# Patient Record
Sex: Female | Born: 1980 | Hispanic: Yes | Marital: Married | Smoking: Never smoker
Health system: Southern US, Community
[De-identification: ages and names within clinical notes are randomized; demographics above are authoritative.]

---

## 2005-03-27 ENCOUNTER — Emergency Department: Payer: Self-pay | Admitting: General Practice

## 2006-05-09 IMAGING — CT CT PARANASAL SINUSES W/O CM
1 series · 16 of 28 positions shown, 20 images · non-contrast
Comparison: none

REASON FOR EXAM: Headache
COMMENTS:

[Series 2: sinus · axial · 0.28mm/px · z∈[+401,+514]mm · 16 of 28 slices shown, 20 images]
[im 2/28  brain]
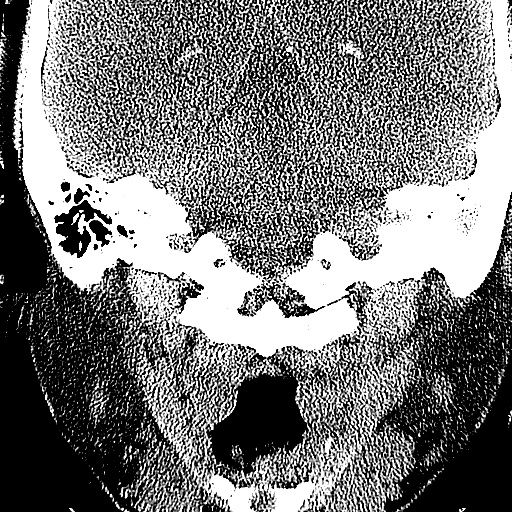
[im 2/28  bone]
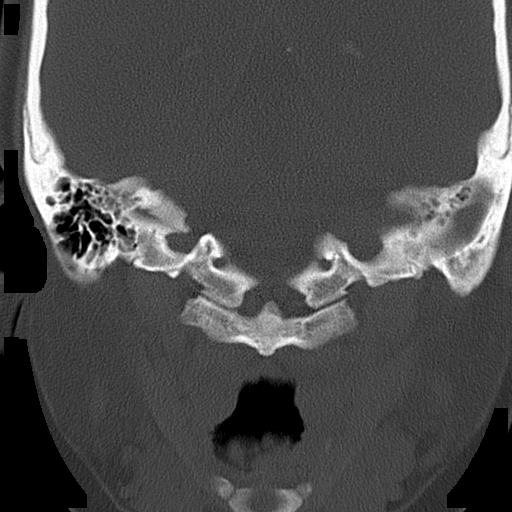
[im 4/28  bone]
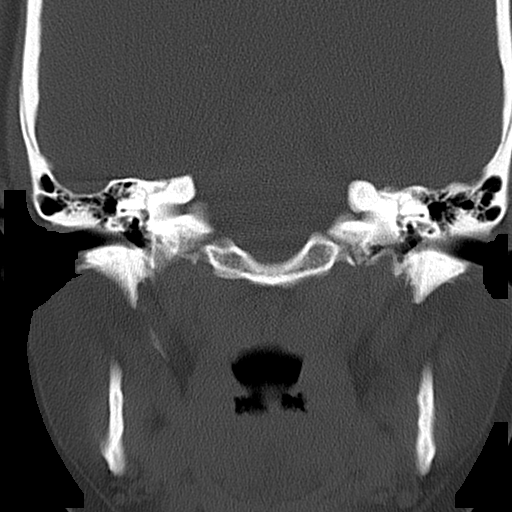
[im 6/28  bone]
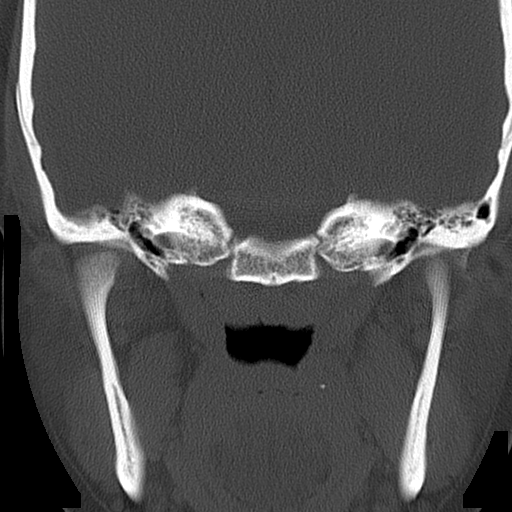
[im 7/28  bone]
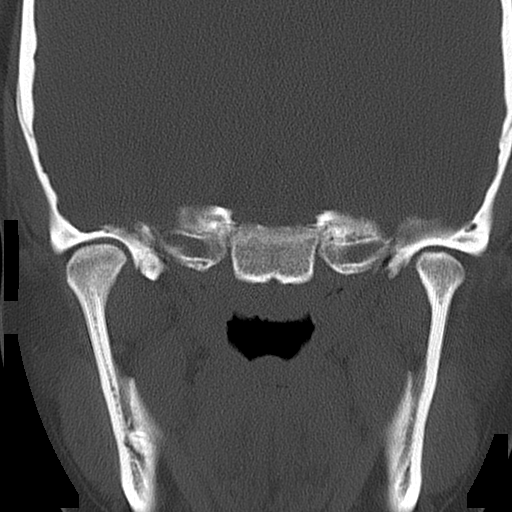
[im 9/28  brain]
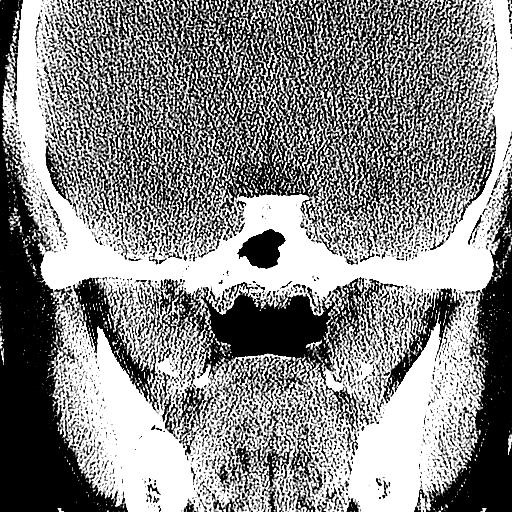
[im 9/28  bone]
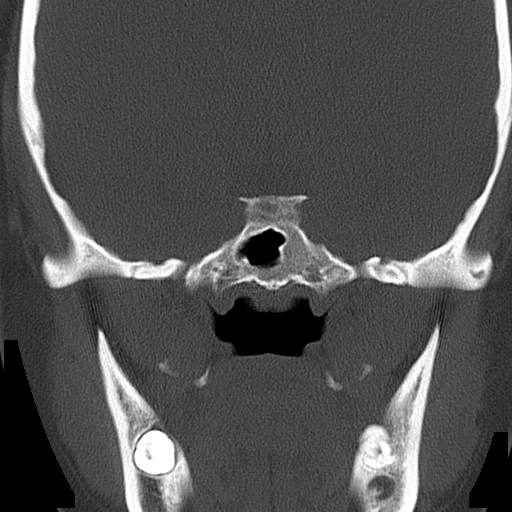
[im 10/28  bone]
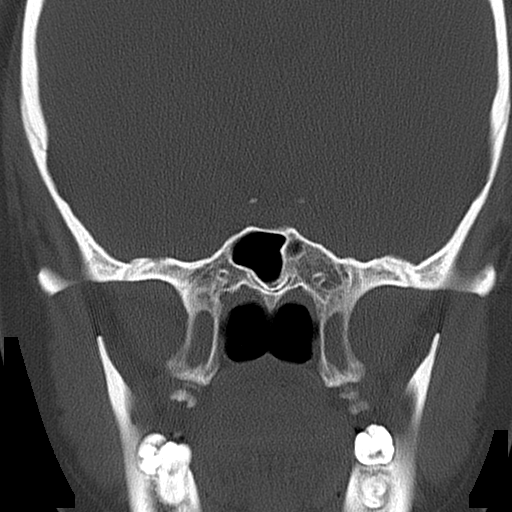
[im 12/28  bone]
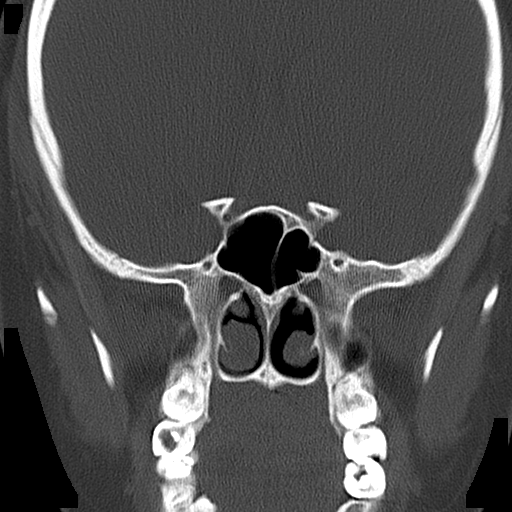
[im 14/28  bone]
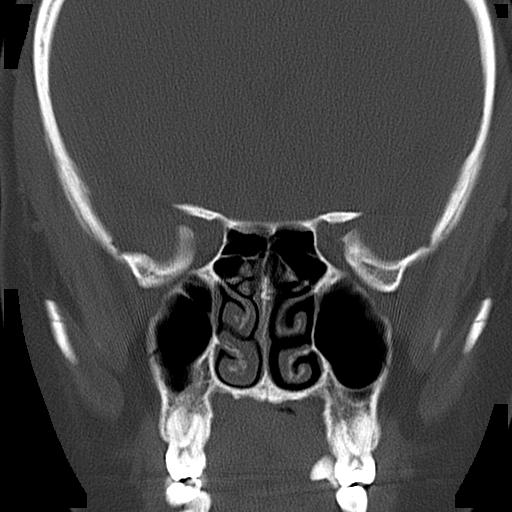
[im 15/28  brain]
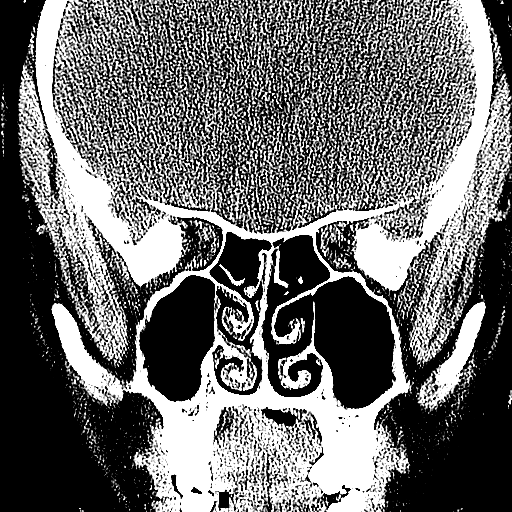
[im 15/28  bone]
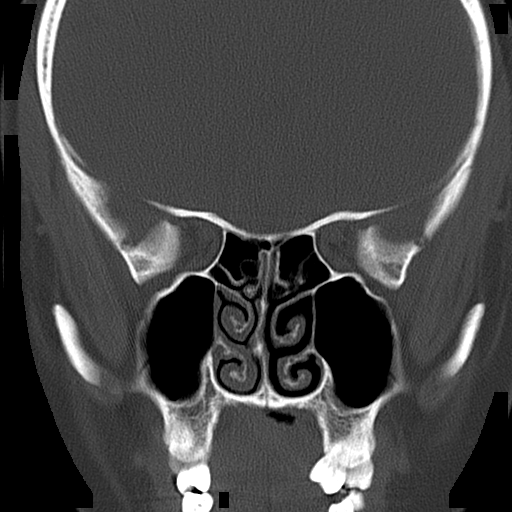
[im 17/28  bone]
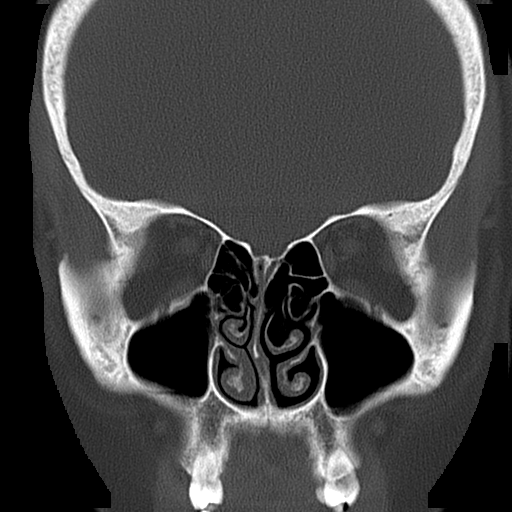
[im 19/28  bone]
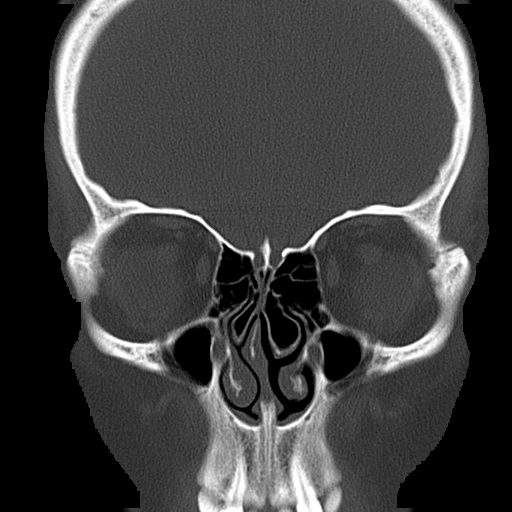
[im 20/28  bone]
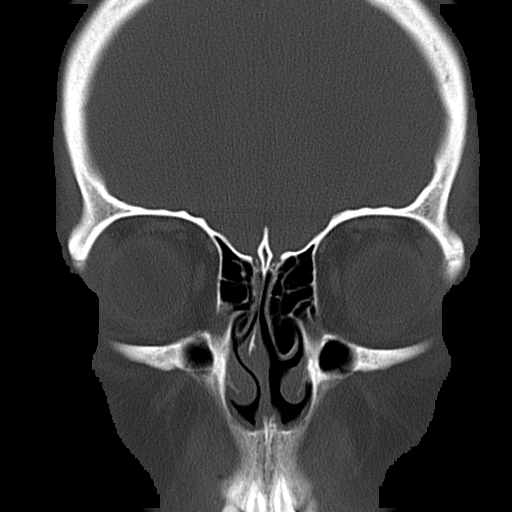
[im 22/28  brain]
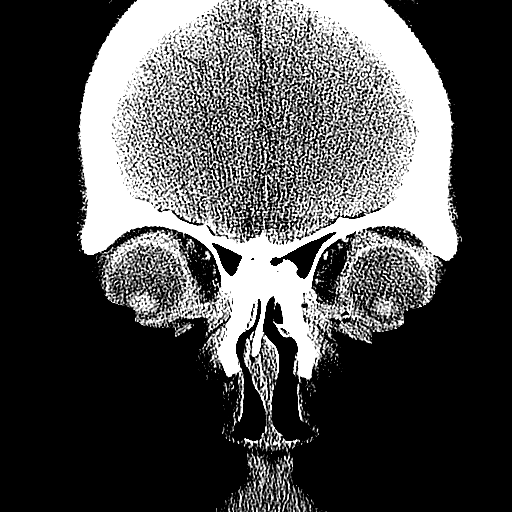
[im 22/28  bone]
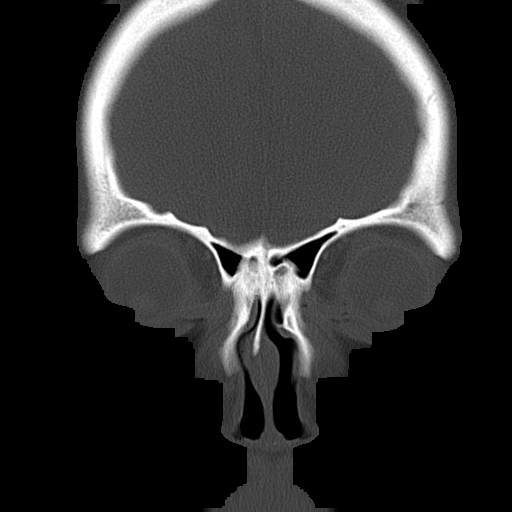
[im 23/28  bone]
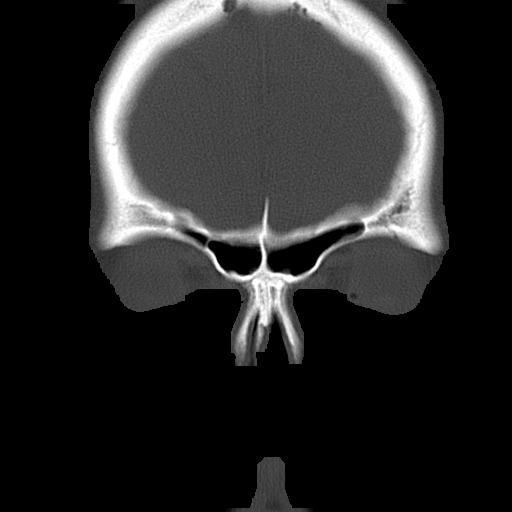
[im 25/28  bone]
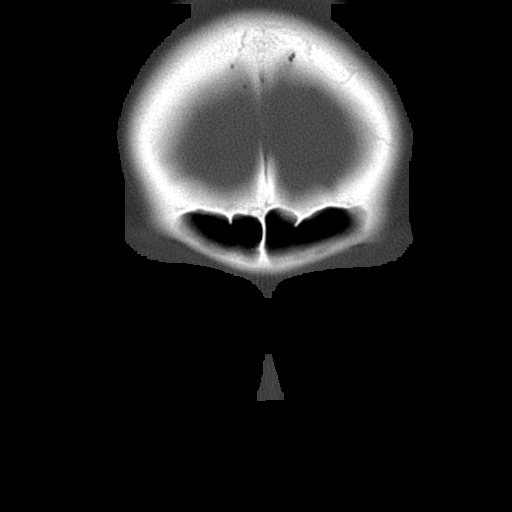
[im 27/28  bone]
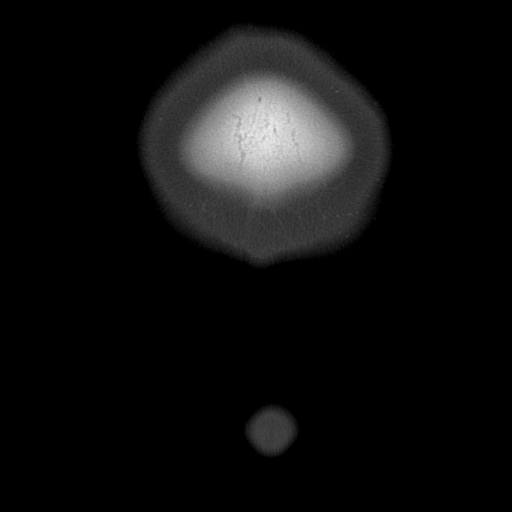

[16 of 28 positions shown; findings below may reference images not displayed]

PROCEDURE:     CT  - CT SINUSES WITHOUT CONTRAST  - March 27, 2005  [DATE]

RESULT:          The exam was originally read by [HOSPITAL].

The infundibulum is patent.  The sinuses are clear with no mucoperiosteal
thickening or air fluid levels identified.  There are noted bilateral concha
bullosa in the middle turbinates.

There is slight deviation of the nasal septum to the RIGHT.
IMPRESSION: No evidence of sinusitis is identified.

## 2006-05-09 IMAGING — CT CT HEAD WITHOUT CONTRAST
1 series · 16 of 28 positions shown, 20 images · non-contrast
Comparison: none

REASON FOR EXAM: Headache
COMMENTS:

PROCEDURE:     CT  - CT HEAD WITHOUT CONTRAST  - March 27, 2005  [DATE]
RESULT:     Unenhanced emergent head CT is performed for headache.
No intracerebral bleeds are noted. There is no mass effect and no shift of
the midline. No extra-axial fluid collections are identified.

[Series 2: without · axial · non-contrast · 0.44mm/px · z∈[+392,+517]mm · 16 of 28 slices shown, 20 images]
[im 2/28  brain]
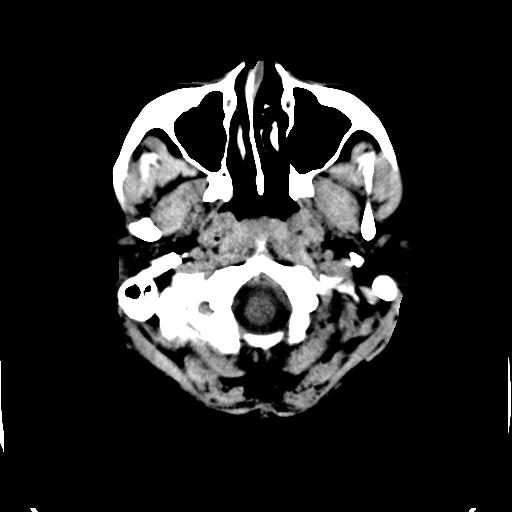
[im 2/28  bone]
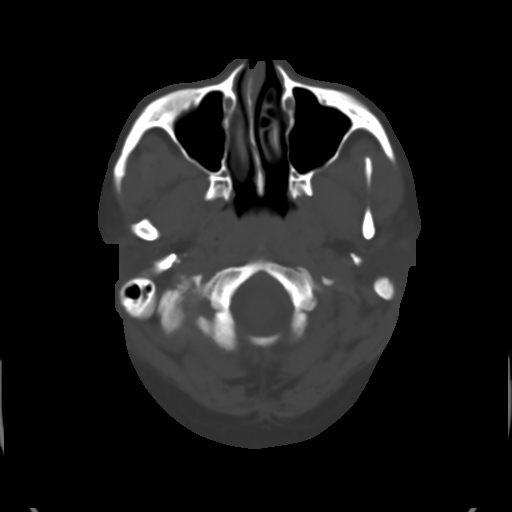
[im 4/28  brain]
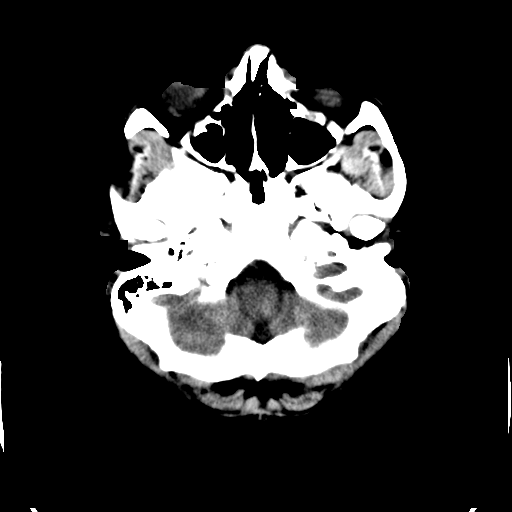
[im 6/28  brain]
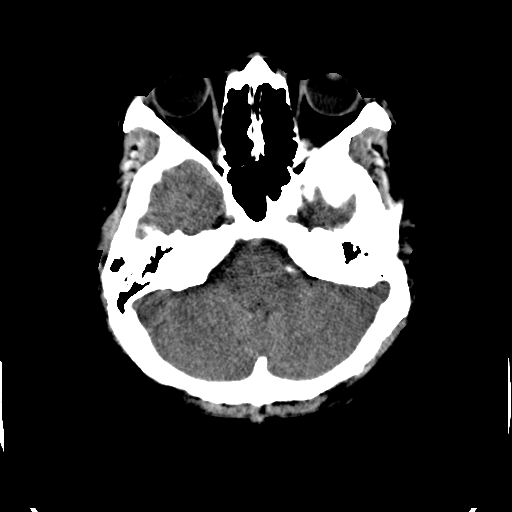
[im 7/28  brain]
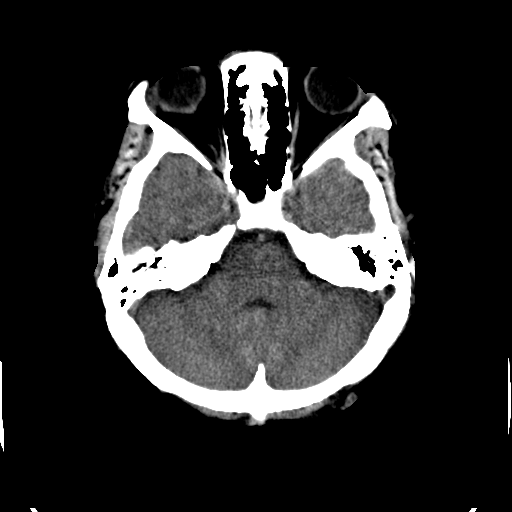
[im 9/28  brain]
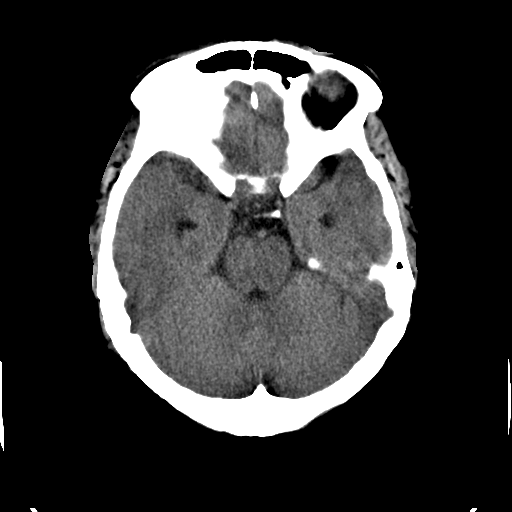
[im 9/28  bone]
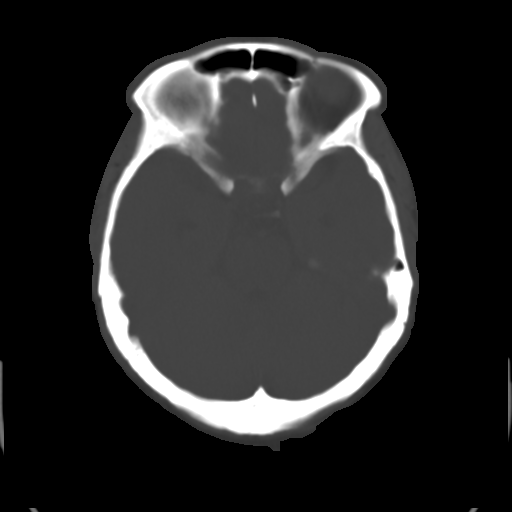
[im 10/28  brain]
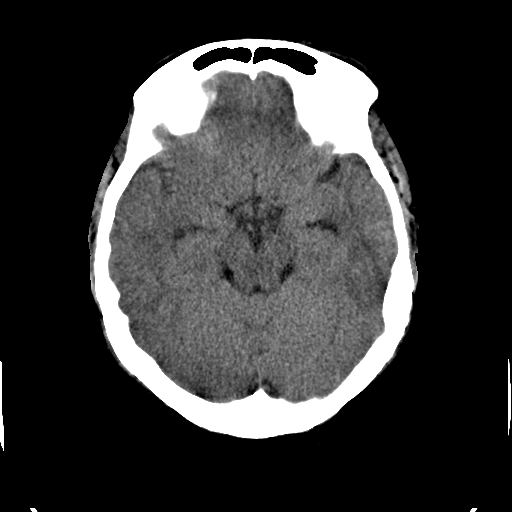
[im 12/28  brain]
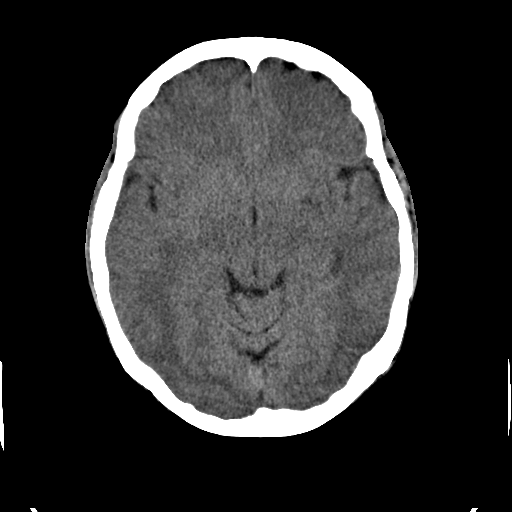
[im 14/28  brain]
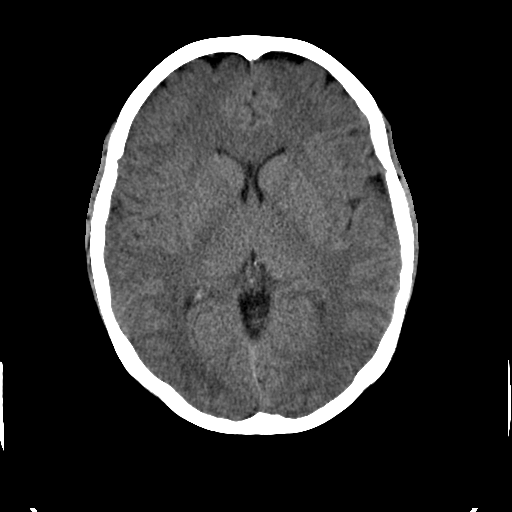
[im 15/28  brain]
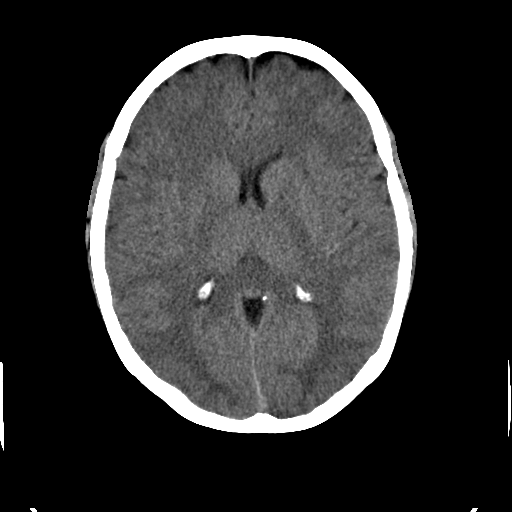
[im 15/28  bone]
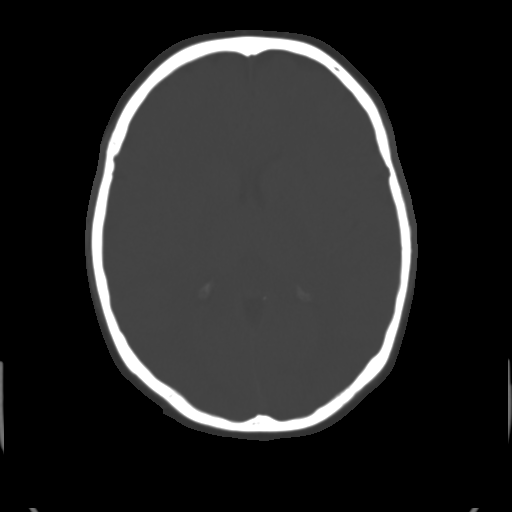
[im 17/28  brain]
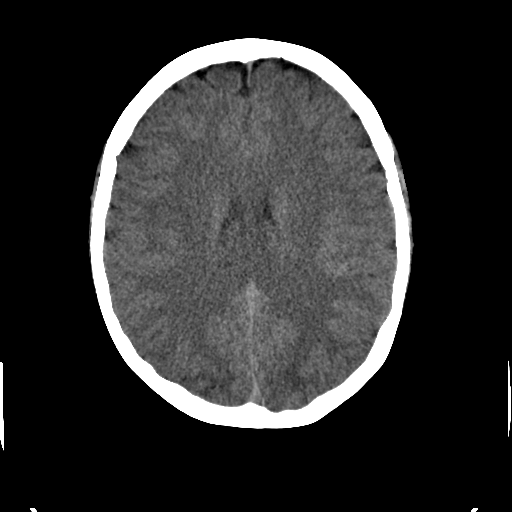
[im 19/28  brain]
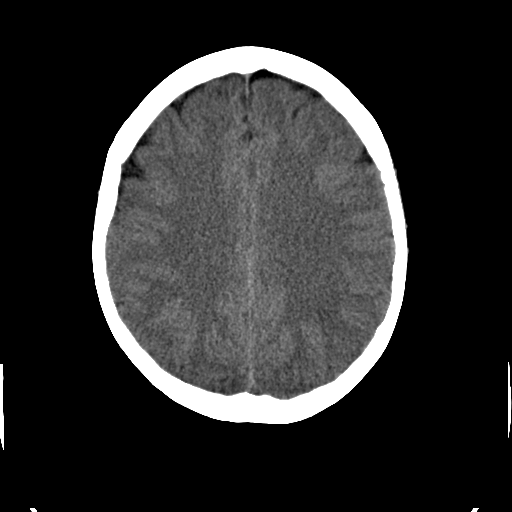
[im 20/28  brain]
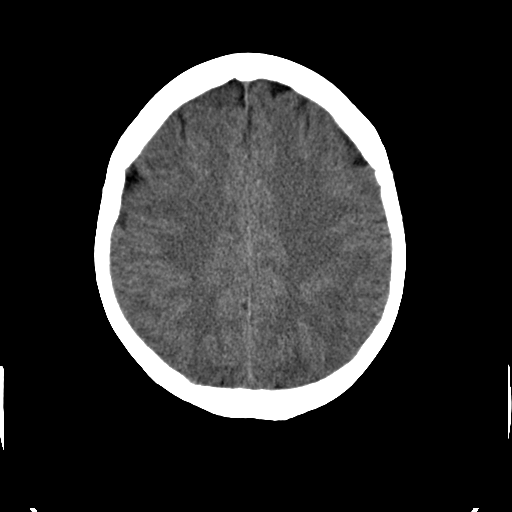
[im 22/28  brain]
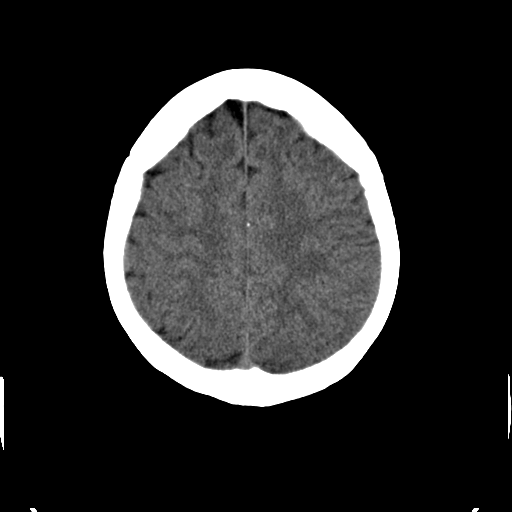
[im 22/28  bone]
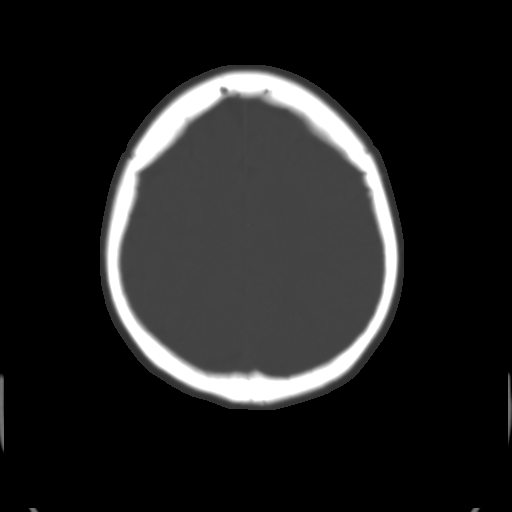
[im 23/28  brain]
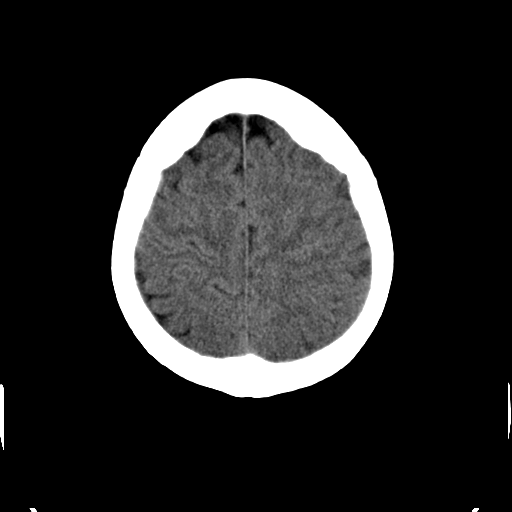
[im 25/28  brain]
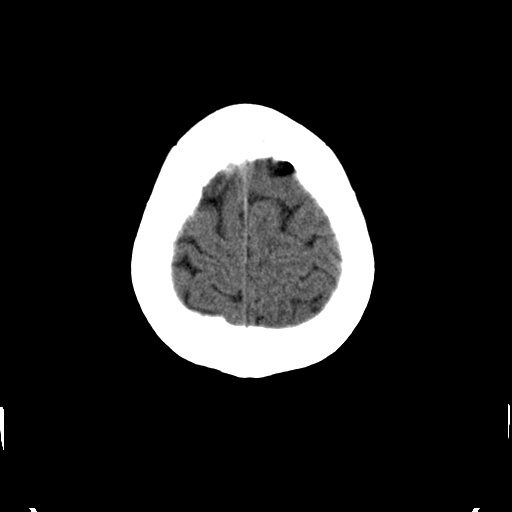
[im 27/28  brain]
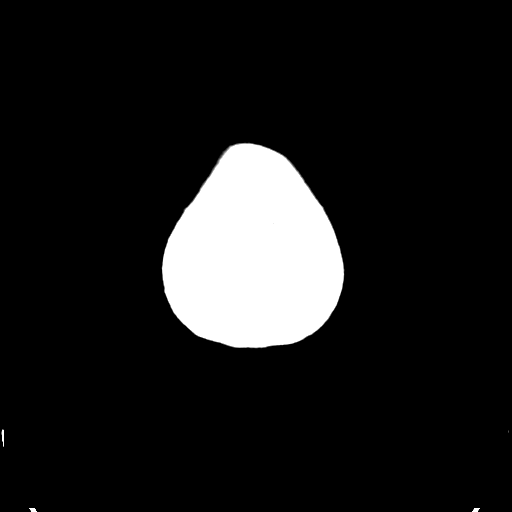

[16 of 28 positions shown; findings below may reference images not displayed]

IMPRESSION: Head CT within normal limits.

## 2019-10-14 ENCOUNTER — Other Ambulatory Visit: Payer: Self-pay

## 2019-10-14 DIAGNOSIS — Z20822 Contact with and (suspected) exposure to covid-19: Secondary | ICD-10-CM

## 2019-10-15 LAB — NOVEL CORONAVIRUS, NAA: SARS-CoV-2, NAA: NOT DETECTED

## 2021-03-31 ENCOUNTER — Other Ambulatory Visit: Payer: Self-pay

## 2021-03-31 ENCOUNTER — Emergency Department
Admission: EM | Admit: 2021-03-31 | Discharge: 2021-03-31 | Disposition: A | Discharge status: Home / Self Care | Payer: Self-pay | Attending: Emergency Medicine | Admitting: Emergency Medicine

## 2021-03-31 DIAGNOSIS — F332 Major depressive disorder, recurrent severe without psychotic features: Secondary | ICD-10-CM

## 2021-03-31 DIAGNOSIS — F32A Depression, unspecified: Secondary | ICD-10-CM

## 2021-03-31 DIAGNOSIS — Z20822 Contact with and (suspected) exposure to covid-19: Secondary | ICD-10-CM | POA: Insufficient documentation

## 2021-03-31 LAB — URINALYSIS, COMPLETE (UACMP) WITH MICROSCOPIC
Bilirubin Urine: NEGATIVE
Glucose, UA: NEGATIVE mg/dL
Ketones, ur: NEGATIVE mg/dL
Nitrite: NEGATIVE
Protein, ur: 100 mg/dL — AB
Specific Gravity, Urine: 1.026 (ref 1.005–1.030)
pH: 5 (ref 5.0–8.0)

## 2021-03-31 LAB — URINE DRUG SCREEN, QUALITATIVE (ARMC ONLY)
Amphetamines, Ur Screen: NOT DETECTED
Barbiturates, Ur Screen: NOT DETECTED
Benzodiazepine, Ur Scrn: NOT DETECTED
Cannabinoid 50 Ng, Ur ~~LOC~~: NOT DETECTED
Cocaine Metabolite,Ur ~~LOC~~: NOT DETECTED
MDMA (Ecstasy)Ur Screen: NOT DETECTED
Methadone Scn, Ur: NOT DETECTED
Opiate, Ur Screen: NOT DETECTED
Phencyclidine (PCP) Ur S: NOT DETECTED
Tricyclic, Ur Screen: NOT DETECTED

## 2021-03-31 LAB — COMPREHENSIVE METABOLIC PANEL
ALT: 16 U/L (ref 0–44)
AST: 19 U/L (ref 15–41)
Albumin: 3.7 g/dL (ref 3.5–5.0)
Alkaline Phosphatase: 59 U/L (ref 38–126)
Anion gap: 9 (ref 5–15)
BUN: 21 mg/dL — ABNORMAL HIGH (ref 6–20)
CO2: 21 mmol/L — ABNORMAL LOW (ref 22–32)
Calcium: 8.9 mg/dL (ref 8.9–10.3)
Chloride: 108 mmol/L (ref 98–111)
Creatinine, Ser: 1.02 mg/dL — ABNORMAL HIGH (ref 0.44–1.00)
GFR, Estimated: >60 mL/min (ref >60)
Glucose, Bld: 103 mg/dL — ABNORMAL HIGH (ref 70–99)
Potassium: 3.8 mmol/L (ref 3.5–5.1)
Sodium: 138 mmol/L (ref 135–145)
Total Bilirubin: 0.4 mg/dL (ref 0.3–1.2)
Total Protein: 7.5 g/dL (ref 6.5–8.1)

## 2021-03-31 LAB — CBC
HCT: 33.3 % — ABNORMAL LOW (ref 36.0–46.0)
Hemoglobin: 10.2 g/dL — ABNORMAL LOW (ref 12.0–15.0)
MCH: 23.3 pg — ABNORMAL LOW (ref 26.0–34.0)
MCHC: 30.6 g/dL (ref 30.0–36.0)
MCV: 76.2 fL — ABNORMAL LOW (ref 80.0–100.0)
Platelets: 376 10*3/uL (ref 150–400)
RBC: 4.37 MIL/uL (ref 3.87–5.11)
RDW: 15.6 % — ABNORMAL HIGH (ref 11.5–15.5)
WBC: 12.8 10*3/uL — ABNORMAL HIGH (ref 4.0–10.5)
nRBC: 0 % (ref 0.0–0.2)

## 2021-03-31 LAB — ETHANOL: Alcohol, Ethyl (B): <10 mg/dL (ref <10)

## 2021-03-31 LAB — POC URINE PREG, ED: Preg Test, Ur: NEGATIVE

## 2021-03-31 LAB — RESP PANEL BY RT-PCR (FLU A&B, COVID) ARPGX2
Influenza A by PCR: NEGATIVE
Influenza B by PCR: NEGATIVE
SARS Coronavirus 2 by RT PCR: NEGATIVE

## 2021-03-31 MED ORDER — IBUPROFEN 600 MG PO TABS
600.0000 mg | ORAL_TABLET | Freq: Once | ORAL | Status: AC
  Administered 2021-03-31: 600 mg via ORAL
  Filled 2021-03-31: qty 1

## 2021-03-31 NOTE — ED Notes (Signed)
VOL, pending placement 

## 2021-03-31 NOTE — ED Triage Notes (Signed)
Pt here for depression, states has been depressed for some time. Was started on fluoxetine 2 days ago and states she had a psychotic break. Has been having trouble with husband and today became angry and broke things in the house. Pt unsure of she has had SI.

## 2021-03-31 NOTE — Discharge Instructions (Signed)
You have been seen in the Emergency Department (ED)  today for a psychiatric complaint.  You have been evaluated by psychiatry and we believe you are safe to be discharged from the hospital.   ° °Please return to the Emergency Department (ED)  immediately if you have ANY thoughts of hurting yourself or anyone else, so that we may help you. ° °Please avoid alcohol and drug use. ° °Follow up with your doctor and/or therapist as soon as possible regarding today's ED  visit.  ° °You may call crisis hotline for [place] at [phone]. ° °

## 2021-03-31 NOTE — ED Notes (Signed)
Vol, pt discharged to home per [facility]

## 2021-03-31 NOTE — BH Assessment (Signed)
Comprehensive Clinical Assessment (CCA) Note  03/31/2021 Caroline Sutton [id number]   Lowella Dandy, 40 year old female who presents to [facility] ED voluntarily for treatment. Per triage note, Pt here for depression, states has been depressed for some time. Was started on fluoxetine 2 days ago and states she had a psychotic break. Has been having trouble with husband and today became angry and broke things in the house. Pt unsure of she has had SI.   During TTS assessment pt presents alert and oriented x 4, restless but cooperative, and mood-congruent with affect. The pt does not appear to be responding to internal or external stimuli. Neither is the pt presenting with any delusional thinking. Pt verified the information provided to triage RN.   Pt identifies her main complaint to be that she is depressed. Patient states she and her husband got into an argument and she became very upset. Patient states they are having marital issues which is causing the depression. Patient states she is not sleeping well; yet her appetite has not changed. 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 Pt reports no INPT hx or OPT hx. Pt reports family hx of depression on her mom's side. Pt reports no medical hx. Patient sees her primary doctor, Dr. Wayne Sever in [place], [place] who started her on fluoxetine and she has been taking as prescribed for the last 2 days. Pt denies current SI/HI/AH/VH.    Per Annice Pih, NP, pt does not meet criteria for inpatient psychiatric admission. TTS will provide outpatient resources for therapy.     Chief Complaint:  Chief Complaint  Patient presents with  . Depression   Visit Diagnosis: Major Depressive Disorder    CCA Screening, Triage and Referral (STR)  Patient Reported Information How did you hear about [place]? Self  Referral name: No data recorded Referral phone number: No data recorded  Whom do you see for routine medical problems? -- (Dr. Wayne Sever in [place],  [place])  Practice/Facility Name: No data recorded Practice/Facility Phone Number: No data recorded Name of Contact: No data recorded Contact Number: No data recorded Contact Fax Number: No data recorded Prescriber Name: No data recorded Prescriber Address (if known): No data recorded  What Is the Reason for Your Visit/Call Today? Patient states she became really angry after getting into an argument with her husband.  How Long Has This Been Causing You Problems? <Week  What Do You Feel Would Help You the Most Today? Stress Management; Medication(s)   Have You Recently Been in Any Inpatient Treatment (Hospital/Detox/Crisis Center/28-Day Program)? No  Name/Location of Program/Hospital:No data recorded How Long Were You There? No data recorded When Were You Discharged? No data recorded  Have You Ever Received Services From [facility] Before? No  Who Do You See at [facility]? No data recorded  Have You Recently Had Any Thoughts About Hurting Yourself? No  Are You Planning to Commit Suicide/Harm Yourself At This time? No   Have you Recently Had Thoughts About Hurting Someone Karolee Ohs? No  Explanation: No data recorded  Have You Used Any Alcohol or Drugs in the Past 24 Hours? No  How Long Ago Did You Use Drugs or Alcohol? No data recorded What Did You Use and How Much? No data recorded  Do You Currently Have a Therapist/Psychiatrist? No  Name of Therapist/Psychiatrist: No data recorded  Have You Been Recently Discharged From Any Office Practice or Programs? No  Explanation of Discharge From Practice/Program: No data recorded  CCA Screening Triage Referral Assessment Type of Contact: Face-to-Face  Is this Initial or Reassessment? No data recorded Date Telepsych consult ordered in [facility]:  No data recorded Time Telepsych consult ordered in [facility]:  No data recorded  Patient Reported Information Reviewed? Yes  Patient Left Without Being Seen? No data recorded Reason for Not  Completing Assessment: No data recorded  Collateral Involvement: None provided   Does Patient Have a Court Appointed Legal Guardian? No data recorded Name and Contact of Legal Guardian: No data recorded If Minor and Not Living with Parent(s), Who has Custody? n/a  Is CPS involved or ever been involved? Never  Is APS involved or ever been involved? Never   Patient Determined To Be At Risk for Harm To Self or Others Based on Review of Patient Reported Information or Presenting Complaint? No  Method: No data recorded Availability of Means: No data recorded Intent: No data recorded Notification Required: No data recorded Additional Information for Danger to Others Potential: No data recorded Additional Comments for Danger to Others Potential: No data recorded Are There Guns or Other Weapons in Your Home? No data recorded Types of Guns/Weapons: No data recorded Are These Weapons Safely Secured?                            No data recorded Who Could Verify You Are Able To Have These Secured: No data recorded Do You Have any Outstanding Charges, Pending Court Dates, Parole/Probation? No data recorded Contacted To Inform of Risk of Harm To Self or Others: No data recorded  Location of Assessment: [facility] ED   Does Patient Present under Involuntary Commitment? No  IVC Papers Initial File Date: No data recorded  [place] of Residence: [place]   Patient Currently Receiving the Following Services: Medication Management   Determination of Need: Urgent (48 hours)   Options For Referral: ED Visit; Medication Management; Intensive Outpatient Therapy     CCA Biopsychosocial Intake/Chief Complaint:  No data recorded Current Symptoms/Problems: No data recorded  Patient Reported Schizophrenia/Schizoaffective Diagnosis in Past: No data recorded  Strengths: No data recorded Preferences: No data recorded Abilities: No data recorded  Type of Services Patient Feels are Needed: No data  recorded  Initial Clinical Notes/Concerns: No data recorded  Mental Health Symptoms Depression:  No data recorded  Duration of Depressive symptoms: No data recorded  Mania:  No data recorded  Anxiety:   No data recorded  Psychosis:  No data recorded  Duration of Psychotic symptoms: No data recorded  Trauma:  No data recorded  Obsessions:  No data recorded  Compulsions:  No data recorded  Inattention:  No data recorded  Hyperactivity/Impulsivity:  No data recorded  Oppositional/Defiant Behaviors:  No data recorded  Emotional Irregularity:  No data recorded  Other Mood/Personality Symptoms:  No data recorded   Mental Status Exam Appearance and self-care  Stature:  No data recorded  Weight:  No data recorded  Clothing:  No data recorded  Grooming:  No data recorded  Cosmetic use:  No data recorded  Posture/gait:  No data recorded  Motor activity:  No data recorded  Sensorium  Attention:  No data recorded  Concentration:  No data recorded  Orientation:  No data recorded  Recall/memory:  No data recorded  Affect and Mood  Affect:  No data recorded  Mood:  No data recorded  Relating  Eye contact:  No data recorded  Facial expression:  No data recorded  Attitude toward examiner:  No data recorded  Thought and Language  Speech flow: No data recorded  Thought content:  No data recorded  Preoccupation:  No data recorded  Hallucinations:  No data recorded  Organization:  No data recorded  [employer] of Knowledge:  No data recorded  Intelligence:  No data recorded  Abstraction:  No data recorded  Judgement:  No data recorded  Reality Testing:  No data recorded  Insight:  No data recorded  Decision Making:  No data recorded  Social Functioning  Social Maturity:  No data recorded  Social Judgement:  No data recorded  Stress  Stressors:  No data recorded  Coping Ability:  No data recorded  Skill Deficits:  No data recorded  Supports:  No data recorded     Religion:    Leisure/Recreation:    Exercise/Diet:     CCA Employment/Education Employment/Work Situation:    Education:     CCA Family/Childhood History Family and Relationship History:    Childhood History:     Child/Adolescent Assessment:     CCA Substance Use Alcohol/Drug Use:                           ASAM's:  Six Dimensions of Multidimensional Assessment  Dimension 1:  Acute Intoxication and/or Withdrawal Potential:      Dimension 2:  Biomedical Conditions and Complications:      Dimension 3:  Emotional, Behavioral, or Cognitive Conditions and Complications:     Dimension 4:  Readiness to Change:     Dimension 5:  Relapse, Continued use, or Continued Problem Potential:     Dimension 6:  Recovery/Living Environment:     ASAM Severity Score:    ASAM Recommended Level of Treatment:     Substance use Disorder (SUD)    Recommendations for Services/Supports/Treatments:    DSM5 Diagnoses: Patient Active Problem List   Diagnosis Date Noted  . MDD (major depressive disorder), recurrent episode, severe (HCC) 03/31/2021    Patient Centered Plan: Patient is on the following Treatment Plan(s):  Depression   Referrals to Alternative Service(s): Referred to Alternative Service(s):   Place:   Date:   Time:    Referred to Alternative Service(s):   Place:   Date:   Time:    Referred to Alternative Service(s):   Place:   Date:   Time:    Referred to Alternative Service(s):   Place:   Date:   Time:     Jacqui Headen Dierdre Searles, Counselor, LCAS-A

## 2021-03-31 NOTE — ED Notes (Addendum)
Belongings removed [employer] bra Panties Red leggings  White shoes Hair tie  Watch Black purse  All locked in [employer] area

## 2021-03-31 NOTE — ED Provider Notes (Addendum)
[facility] Emergency Department Provider Note  ____________________________________________  Time seen: Approximately 2:44 AM  I have reviewed the triage vital signs and the nursing notes.   HISTORY  Chief Complaint Depression   HPI Caroline Sutton is a 40 y.o. female who presents for evaluation of depression.  Patient reports that she is having marital problems and has been very depressed over the last month.  She denies any prior history of depression.  She saw her primary care doctor who started her on fluoxetine.  She has taken it for the last 2 days.  Today she reports that she had "a psychotic break."  She had a very bad argument with her husband, became very angry, broke things around the house.  She reports that that happened in front of her child and she felt really bad.  She denies any suicidal or homicidal thoughts.  She is requesting help.  She is complaining of a very bad headache that started during this episode.  She usually takes ibuprofen for these headaches.  She denies any other medical complaints.   PMH None   Allergies Morphine and related  No family history on file.  Social History  Drugs - no Smoking - no  Review of Systems  Constitutional: Negative for fever. Eyes: Negative for visual changes. ENT: Negative for sore throat. Neck: No neck pain  Cardiovascular: Negative for chest pain. Respiratory: Negative for shortness of breath. Gastrointestinal: Negative for abdominal pain, vomiting or diarrhea. Genitourinary: Negative for dysuria. Musculoskeletal: Negative for back pain. Skin: Negative for rash. Neurological: Negative for headaches, weakness or numbness. Psych: No SI or HI. + depression  ____________________________________________   PHYSICAL EXAM:  VITAL SIGNS: ED Triage Vitals [03/31/21 0205]  Enc Vitals Group     BP (!) 199/136     Pulse Rate 90     Resp 18     Temp 98.5 F (36.9 C)     Temp Source Oral      SpO2 98 %     Weight 205 lb (93 kg)     Height 5\' 3"  (1.6 m)     Head Circumference      Peak Flow      Pain Score 3     Pain Loc      Pain Edu?      Excl. in GC?     Constitutional: Alert and oriented. Well appearing and in no apparent distress. HEENT:      Head: Normocephalic and atraumatic.         Eyes: Conjunctivae are normal. Sclera is non-icteric.       Mouth/Throat: Mucous membranes are moist.       Neck: Supple with no signs of meningismus. Cardiovascular: Regular rate and rhythm. No murmurs, gallops, or rubs. 2+ symmetrical distal pulses are present in all extremities. No JVD. Respiratory: Normal respiratory effort. Lungs are clear to auscultation bilaterally.  Gastrointestinal: Soft, non tender, and non distended with positive bowel sounds. No rebound or guarding. Genitourinary: No CVA tenderness. Musculoskeletal:  No edema, cyanosis, or erythema of extremities. Neurologic: Normal speech and language. Face is symmetric. Moving all extremities. No gross focal neurologic deficits are appreciated. Skin: Skin is warm, dry and intact. No rash noted. Psychiatric: Mood and affect are depressed. Speech and behavior are normal.  ____________________________________________   LABS (all labs ordered are listed, but only abnormal results are displayed)  Labs Reviewed  CBC - Abnormal; Notable for the following components:  Result Value   WBC 12.8 (*)    Hemoglobin 10.2 (*)    HCT 33.3 (*)    MCV 76.2 (*)    MCH 23.3 (*)    RDW 15.6 (*)    All other components within normal limits  COMPREHENSIVE METABOLIC PANEL - Abnormal; Notable for the following components:   CO2 21 (*)    Glucose, Bld 103 (*)    BUN 21 (*)    Creatinine, Ser 1.02 (*)    All other components within normal limits  RESP PANEL BY RT-PCR (FLU A&B, COVID) ARPGX2  ETHANOL  URINALYSIS, COMPLETE (UACMP) WITH MICROSCOPIC  URINE DRUG SCREEN, QUALITATIVE ([facility] ONLY)  POC URINE PREG, ED    ____________________________________________  EKG  none  ____________________________________________  RADIOLOGY  none  ____________________________________________   PROCEDURES  Procedure(s) performed: None Procedures Critical Care performed:  None ____________________________________________   INITIAL IMPRESSION / ASSESSMENT AND PLAN / ED COURSE   40 y.o. female who presents for evaluation of depression.  No suicidal thoughts.  Patient does not meet IVC criteria.  Will consult psychiatry.   The patient has been placed in psychiatric observation due to the need to provide a safe environment for the patient while obtaining psychiatric consultation and evaluation, as well as ongoing medical and medication management to treat the patient's condition.  The patient has not been placed under full IVC at this time.  _________________________ 6:32 AM on 03/31/2021 -----------------------------------------  Patient seen by psychiatry and recommended outpatient management.  Resources were provided to patient.  She is currently being treated by her primary care doctor.  Recommended that she continue the medication that she started 2 days ago.  We discussed returning to the emergency room for any suicidal thoughts or if her symptoms are getting worse.  Patient provided with a crisis hotline.  Patient continues to deny suicidal thoughts and can contract for safety.      _____________________________________________ Please note:  Patient was evaluated in Emergency Department today for the symptoms described in the history of present illness. Patient was evaluated in the context of the global COVID-19 pandemic, which necessitated consideration that the patient might be at risk for infection with the SARS-CoV-2 virus that causes COVID-19. Institutional protocols and algorithms that pertain to the evaluation of patients at risk for COVID-19 are in a state of rapid change based on  information released by regulatory bodies including the CDC and federal and state organizations. These policies and algorithms were followed during the patient's care in the ED.  Some ED evaluations and interventions may be delayed as a result of limited staffing during the pandemic.   [place] Controlled Substance Database was reviewed by me. ____________________________________________   FINAL CLINICAL IMPRESSION(S) / ED DIAGNOSES   Final diagnoses:  Depression, unspecified depression type      NEW MEDICATIONS STARTED DURING THIS VISIT:  ED Discharge Orders    None       Note:  This document was prepared using Dragon voice recognition software and may include unintentional dictation errors.    Don Perking, [place], MD 03/31/21 [id number]    Nita Sickle, MD 03/31/21 [phone]

## 2021-03-31 NOTE — Consult Note (Signed)
[facility] Face-to-Face Psychiatry Consult   Reason for Consult: Depression Referring Physician:  Dr. Don Perking Patient Identification: Caroline Sutton MRN:  [id number] Principal Diagnosis: <principal problem not specified> Diagnosis:  Active Problems:   MDD (major depressive disorder), recurrent episode, severe (HCC)   Total Time spent with patient: 30 minutes  Subjective: " I have been on Zoloft for only two days and I can feel the difference." Caroline Sutton is a 40 y.o. female patient presented to [facility] ED via POV voluntary.  Per the ED triage nurse note, Pt here for depression, states has been depressed for some time. Was started on fluoxetine 2 days ago and states she had a psychotic break. Has been having trouble with husband and today became angry and broke things in the house. Pt unsure of she has had SI.   The patient shared she and her husband got into a heated argument before one of her children. 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   The patient was seen face-to-face by this provider; the chart was reviewed and consulted with Dr. Don Perking on 03/31/2021 due to the patient's care. It was discussed with the EDP that the patient does not meet the criteria to be admitted to the psychiatric inpatient unit. On evaluation, the patient is alert and oriented x4, calm, and cooperative, and her mood is congruent with affect.  The patient does not appear to be responding to internal or external stimuli. Neither is the patient presenting with any delusional thinking. The patient denies auditory or visual hallucinations. The patient denies any suicidal, homicidal, or self-harm ideations. The patient is not presenting with any psychotic or paranoid behaviors. During an encounter with the patient, she was able to answer questions appropriately.  HPI: Per Dr. Don Perking, Caroline Sutton is a 40 y.o. female who presents for evaluation of depression.  Patient reports that she is having marital problems and has been very depressed over the last month.  She denies any prior history of depression.  She saw her primary care doctor who started her on fluoxetine.  She has taken it for the last 2 days.  Today she reports that she had "a psychotic break."  She had a very bad argument with her husband, became very angry, broke things around the house.  She reports that that happened in front of her child and she felt really bad.  She denies any suicidal or homicidal thoughts.  She is requesting help.  She is complaining of a very bad headache that started during this episode.  She usually takes ibuprofen for these headaches.  She denies any other medical complaints.  Past Psychiatric History: No pertinent past psychiatric history  Risk to Self:   Risk to Others:   Prior Inpatient Therapy:   Prior Outpatient Therapy:    Past Medical History: No past medical history on file.  Family History: No family history on file. Family Psychiatric  History: Maternal-depression Social History:  Social History   Substance and Sexual Activity  Alcohol Use Not on file     Social History   Substance and Sexual Activity  Drug Use Not on file    Social History   Socioeconomic History  . Marital status: Married    Spouse name: Not on file  . Number of children: Not on file  . Years of education: Not on  file  . Highest education level: Not on file  Occupational History  . Not on file  Tobacco Use  . Smoking status: Not on file  . Smokeless tobacco: Not on file  Substance and Sexual Activity  . Alcohol use: Not on file  . Drug use: Not on file  . Sexual activity: Not on file  Other Topics Concern  . Not on file  Social History Narrative  . Not on file   Social Determinants of Health   Financial Resource Strain: Not on file  Food Insecurity: Not on file  Transportation Needs:  Not on file  Physical Activity: Not on file  Stress: Not on file  Social Connections: Not on file   Additional Social History:    Allergies:   Allergies  Allergen Reactions  . Morphine And Related Rash    Labs:  Results for orders placed or performed during the hospital encounter of 03/31/21 (from the past 48 hour(s))  CBC     Status: Abnormal   Collection Time: 03/31/21  2:09 AM  Result Value Ref Range   WBC 12.8 (H) 4.0 - 10.5 K/uL   RBC 4.37 3.87 - 5.11 MIL/uL   Hemoglobin 10.2 (L) 12.0 - 15.0 g/dL   HCT 78.233.3 (L) 95.636.0 - 21.346.0 %   MCV 76.2 (L) 80.0 - 100.0 fL   MCH 23.3 (L) 26.0 - 34.0 pg   MCHC 30.6 30.0 - 36.0 g/dL   RDW 08.615.6 (H) 57.811.5 - 46.915.5 %   Platelets 376 150 - 400 K/uL   nRBC 0.0 0.0 - 0.2 %    Comment: Performed at [facility], [address] [place], [place] [address]  Comprehensive metabolic panel     Status: Abnormal   Collection Time: 03/31/21  2:09 AM  Result Value Ref Range   Sodium 138 135 - 145 mmol/L   Potassium 3.8 3.5 - 5.1 mmol/L   Chloride 108 98 - 111 mmol/L   CO2 21 (L) 22 - 32 mmol/L   Glucose, Bld 103 (H) 70 - 99 mg/dL    Comment: Glucose reference range applies only to samples taken after fasting for at least 8 hours.   BUN 21 (H) 6 - 20 mg/dL   Creatinine, Ser 8.411.02 (H) 0.44 - 1.00 mg/dL   Calcium 8.9 8.9 - 32.410.3 mg/dL   Total Protein 7.5 6.5 - 8.1 g/dL   Albumin 3.7 3.5 - 5.0 g/dL   AST 19 15 - 41 U/L   ALT 16 0 - 44 U/L   Alkaline Phosphatase 59 38 - 126 U/L   Total Bilirubin 0.4 0.3 - 1.2 mg/dL   GFR, Estimated >40>60 >10>60 mL/min    Comment: (NOTE) Calculated using the CKD-EPI Creatinine Equation (2021)    Anion gap 9 5 - 15    Comment: Performed at [facility], [address] [place], [place] [address]  Ethanol     Status: None   Collection Time: 03/31/21  2:09 AM  Result Value Ref Range   Alcohol, Ethyl (B) <10 <10 mg/dL    Comment: (NOTE) Lowest detectable limit for serum alcohol is 10 mg/dL.  For medical  purposes only. Performed at [facility], [address] [place], [place] [address]   Urinalysis, Complete w Microscopic     Status: Abnormal   Collection Time: 03/31/21  2:09 AM  Result Value Ref Range   Color, Urine YELLOW (A) YELLOW   APPearance HAZY (A) CLEAR   Specific Gravity, Urine 1.026  1.005 - 1.030   pH 5.0 5.0 - 8.0   Glucose, UA NEGATIVE NEGATIVE mg/dL   Hgb urine dipstick MODERATE (A) NEGATIVE   Bilirubin Urine NEGATIVE NEGATIVE   Ketones, ur NEGATIVE NEGATIVE mg/dL   Protein, ur 950 (A) NEGATIVE mg/dL   Nitrite NEGATIVE NEGATIVE   Leukocytes,Ua TRACE (A) NEGATIVE   RBC / HPF 0-5 0 - 5 RBC/hpf   WBC, UA 6-10 0 - 5 WBC/hpf   Bacteria, UA RARE (A) NONE SEEN   Squamous Epithelial / LPF 6-10 0 - 5   Mucus PRESENT    Hyaline Casts, UA PRESENT     Comment: Performed at [facility], [address], [place], [place] [address]  Urine Drug Screen, Qualitative ([facility] only)     Status: None   Collection Time: 03/31/21  2:09 AM  Result Value Ref Range   Tricyclic, Ur Screen NONE DETECTED NONE DETECTED   Amphetamines, Ur Screen NONE DETECTED NONE DETECTED   MDMA (Ecstasy)Ur Screen NONE DETECTED NONE DETECTED   Cocaine Metabolite,Ur [place] NONE DETECTED NONE DETECTED   Opiate, Ur Screen NONE DETECTED NONE DETECTED   Phencyclidine (PCP) Ur S NONE DETECTED NONE DETECTED   Cannabinoid 50 Ng, Ur [place] NONE DETECTED NONE DETECTED   Barbiturates, Ur Screen NONE DETECTED NONE DETECTED   Benzodiazepine, Ur Scrn NONE DETECTED NONE DETECTED   Methadone Scn, Ur NONE DETECTED NONE DETECTED    Comment: (NOTE) Tricyclics + metabolites, urine    Cutoff 1000 ng/mL Amphetamines + metabolites, urine  Cutoff 1000 ng/mL MDMA (Ecstasy), urine              Cutoff 500 ng/mL Cocaine Metabolite, urine          Cutoff 300 ng/mL Opiate + metabolites, urine        Cutoff 300 ng/mL Phencyclidine (PCP), urine         Cutoff 25 ng/mL Cannabinoid, urine                 Cutoff 50  ng/mL Barbiturates + metabolites, urine  Cutoff 200 ng/mL Benzodiazepine, urine              Cutoff 200 ng/mL Methadone, urine                   Cutoff 300 ng/mL  The urine drug screen provides only a preliminary, unconfirmed analytical test result and should not be used for non-medical purposes. Clinical consideration and professional judgment should be applied to any positive drug screen result due to possible interfering substances. A more specific alternate chemical method must be used in order to obtain a confirmed analytical result. Gas chromatography / mass spectrometry (GC/MS) is the preferred confirm atory method. Performed at [facility], [address] [place], [place] [address]   Resp Panel by RT-PCR (Flu A&B, Covid) Nasopharyngeal Swab     Status: None   Collection Time: 03/31/21  2:36 AM   Specimen: Nasopharyngeal Swab; Nasopharyngeal(NP) swabs in vial transport medium  Result Value Ref Range   SARS Coronavirus 2 by RT PCR NEGATIVE NEGATIVE    Comment: (NOTE) SARS-CoV-2 target nucleic acids are NOT DETECTED.  The SARS-CoV-2 RNA is generally detectable in upper respiratory specimens during the acute phase of infection. The lowest concentration of SARS-CoV-2 viral copies this assay can detect is 138 copies/mL. A negative result does not preclude SARS-Cov-2 infection and should not be used as the sole basis for treatment or other patient management decisions. A negative result may  occur with  improper specimen collection/handling, submission of specimen other than nasopharyngeal swab, presence of viral mutation(s) within the areas targeted by this assay, and inadequate number of viral copies(<138 copies/mL). A negative result must be combined with clinical observations, patient history, and epidemiological information. The expected result is Negative.  Fact Sheet for Patients:  BloggerCourse.com  Fact Sheet for Healthcare  Providers:  SeriousBroker.it  This test is no t yet approved or cleared by the [place] FDA and  has been authorized for detection and/or diagnosis of SARS-CoV-2 by FDA under an Emergency Use Authorization (EUA). This EUA will remain  in effect (meaning this test can be used) for the duration of the COVID-19 declaration under Section 564(b)(1) of the Act, 21 U.S.C.section 360bbb-3(b)(1), unless the authorization is terminated  or revoked sooner.       Influenza A by PCR NEGATIVE NEGATIVE   Influenza B by PCR NEGATIVE NEGATIVE    Comment: (NOTE) The Xpert Xpress SARS-CoV-2/FLU/RSV plus assay is intended as an aid in the diagnosis of influenza from Nasopharyngeal swab specimens and should not be used as a sole basis for treatment. Nasal washings and aspirates are unacceptable for Xpert Xpress SARS-CoV-2/FLU/RSV testing.  Fact Sheet for Patients: BloggerCourse.com  Fact Sheet for Healthcare Providers: SeriousBroker.it  This test is not yet approved or cleared by the [place] FDA and has been authorized for detection and/or diagnosis of SARS-CoV-2 by FDA under an Emergency Use Authorization (EUA). This EUA will remain in effect (meaning this test can be used) for the duration of the COVID-19 declaration under Section 564(b)(1) of the Act, 21 U.S.C. section 360bbb-3(b)(1), unless the authorization is terminated or revoked.  Performed at [facility], [address] [place], [place] [address]   POC Urine Pregnancy, ED     Status: None   Collection Time: 03/31/21  2:49 AM  Result Value Ref Range   Preg Test, Ur NEGATIVE NEGATIVE    Comment:        THE SENSITIVITY OF THIS METHODOLOGY IS >24 mIU/mL     No current facility-administered medications for this encounter.   No current outpatient medications on file.    Musculoskeletal: Strength & Muscle Tone: within normal  limits Gait & Station: normal Patient leans: N/A  Psychiatric Specialty Exam:  Presentation  General Appearance: Appropriate for Environment  Eye Contact:Good  Speech:Clear and Coherent  Speech Volume:Decreased  Handedness:Right   Mood and Affect  Mood:Depressed  Affect:Blunt; Flat; Depressed   Thought Process  Thought Processes:Coherent  Descriptions of Associations:Intact  Orientation:Full (Time, Place and Person)  Thought Content:Logical  History of Schizophrenia/Schizoaffective disorder:No data recorded Duration of Psychotic Symptoms:No data recorded Hallucinations:Hallucinations: None  Ideas of Reference:None  Suicidal Thoughts:Suicidal Thoughts: No  Homicidal Thoughts:Homicidal Thoughts: No   Sensorium  Memory:Immediate Good; Recent Good; Remote Good  Judgment:Good  Insight:Good   Executive Functions  Concentration:Good  Attention Span:Good  Recall:Good  Fund of Knowledge:Good  Language:Good   Psychomotor Activity  Psychomotor Activity:Psychomotor Activity: Normal   Assets  Assets:Communication Skills; Desire for Improvement; Intimacy; Resilience; Social Support   Sleep  Sleep:Sleep: Poor   Physical Exam: Physical Exam Vitals and nursing note reviewed.  HENT:     Nose: Nose normal.     Mouth/Throat:     Mouth: Mucous membranes are moist.  Cardiovascular:     Rate and Rhythm: Normal rate.     Pulses: Normal pulses.  Pulmonary:     Effort: Pulmonary effort is normal.  Musculoskeletal:  General: Normal range of motion.     Cervical back: Normal range of motion and neck supple.  Neurological:     General: No focal deficit present.     Mental Status: She is alert and oriented to person, place, and time.  Psychiatric:        Attention and Perception: Attention and perception normal.        Mood and Affect: Mood is depressed. Affect is blunt.        Speech: Speech normal.        Behavior: Behavior is agitated and  withdrawn. Behavior is cooperative.        Thought Content: Thought content normal.        Cognition and Memory: Cognition and memory normal.        Judgment: Judgment normal.    Review of Systems  Psychiatric/Behavioral: Positive for depression. The patient is nervous/anxious and has insomnia.   All other systems reviewed and are negative.  Blood pressure (!) 199/136, pulse 90, temperature 98.5 F (36.9 C), temperature source Oral, resp. rate 18, height  (1.6 m), weight 93 kg, last menstrual period 03/02/2021, SpO2 98 %. Body mass index is 36.31 kg/m.  Treatment Plan Summary: Plan The patient is not a safety risk to herself or others and does not require psychiatric inpatient admission for stabilization and treatment.  Disposition: No evidence of imminent risk to self or others at present.   Patient does not meet criteria for psychiatric inpatient admission. Supportive therapy provided about ongoing stressors. Refer to IOP. Discussed crisis plan, support from social network, calling 911, coming to the Emergency Department, and calling Suicide Hotline.  Gillermo Murdoch, NP 03/31/2021 5:40 AM

## 2024-01-14 ENCOUNTER — Ambulatory Visit: Payer: BC Managed Care – PPO | Admitting: Urology

## 2024-01-14 VITALS — BP 138/83 | HR 75 | Ht 63.0 in | Wt 205.0 lb

## 2024-01-14 DIAGNOSIS — N3946 Mixed incontinence: Secondary | ICD-10-CM | POA: Diagnosis not present

## 2024-01-14 DIAGNOSIS — N393 Stress incontinence (female) (male): Secondary | ICD-10-CM

## 2024-01-14 LAB — URINALYSIS, COMPLETE
Bilirubin, UA: NEGATIVE
Glucose, UA: NEGATIVE
Ketones, UA: NEGATIVE
Leukocytes,UA: NEGATIVE
Nitrite, UA: NEGATIVE
Specific Gravity, UA: >1.03 — ABNORMAL HIGH (ref 1.005–1.030)
Urobilinogen, Ur: 0.2 mg/dL (ref 0.2–1.0)
pH, UA: 5.5 (ref 5.0–7.5)

## 2024-01-14 LAB — MICROSCOPIC EXAMINATION: Epithelial Cells (non renal): >10 /HPF — AB (ref 0–10)

## 2024-01-14 NOTE — Progress Notes (Signed)
 01/14/2024 9:04 AM   Caroline Sutton 1981/02/08 [id number]  Referring provider: Macy Mis, MD [address] [place],  [place] [address]  Chief Complaint  Patient presents with   Follow-up    HPI: I was consulted to assess the patient's urinary incontinence.  She sometimes leaks with coughing sneezing and bending and lifting.  If she stretches a certain way she will leak.  Sometimes she has small-volume urge incontinence.  She says she feels dampness a lot of the time and possibly even at night with leakage without awareness.  She does not think it is discharge.  She wears 1-2 pads a day that are damp  She voids every 2 hours and does not have nocturia.  Her flow was weak and she sometimes does not feel empty  Patient says she had an urgent frequent bladder even as a child  No hysterectomy  Alternating diarrhea and constipation.  No treatment for incontinence.  No history of kidney stones bladder surgery or urinary tract infections.  No neurologic issues   PMH: No past medical history on file.  Surgical History:   Home Medications:  Allergies as of 01/14/2024       Reactions   Morphine And Codeine Rash        Medication List        Accurate as of January 14, 2024  9:04 AM. If you have any questions, ask your nurse or doctor.          atenolol 25 MG tablet Commonly known as: TENORMIN   tirzepatide 2.5 MG/0.5ML Pen Commonly known as: MOUNJARO        Allergies:  Allergies  Allergen Reactions   Morphine And Codeine Rash    Family History: No family history on file.  Social History:  has no history on file for tobacco use, alcohol use, and drug use.  ROS:                                        Physical Exam: BP 138/83   Pulse 75   Ht 5\' 3"  (1.6 m)   Wt 93 kg   BMI 36.31 kg/m   Constitutional:  Alert and oriented, No acute distress. HEENT: [place] AT, moist mucus membranes.  Trachea midline, no  masses. Cardiovascular: No clubbing, cyanosis, or edema. Respiratory: Normal respiratory effort, no increased work of breathing. GI: Abdomen is soft, nontender, nondistended, no abdominal masses GU: On pelvic examination patient had grade 1 hypermobility of the bladder neck and no stress incontinence with a moderate cough.  No prolapse Skin: No rashes, bruises or suspicious lesions. Lymph: No cervical or inguinal adenopathy. Neurologic: Grossly intact, no focal deficits, moving all 4 extremities. Psychiatric: Normal mood and affect.  Laboratory Data: Lab Results  Component Value Date   WBC 12.8 (H) 03/31/2021   HGB 10.2 (L) 03/31/2021   HCT 33.3 (L) 03/31/2021   MCV 76.2 (L) 03/31/2021   PLT 376 03/31/2021    Lab Results  Component Value Date   CREATININE 1.02 (H) 03/31/2021    No results found for: "PSA"  No results found for: "TESTOSTERONE"  No results found for: "HGBA1C"  Urinalysis    Component Value Date/Time   COLORURINE YELLOW (A) 03/31/2021 0209   APPEARANCEUR HAZY (A) 03/31/2021 0209   LABSPEC 1.026 03/31/2021 0209   PHURINE 5.0 03/31/2021 0209   GLUCOSEU NEGATIVE 03/31/2021  0209   HGBUR MODERATE (A) 03/31/2021 0209   BILIRUBINUR NEGATIVE 03/31/2021 0209   KETONESUR NEGATIVE 03/31/2021 0209   PROTEINUR 100 (A) 03/31/2021 0209   NITRITE NEGATIVE 03/31/2021 0209   LEUKOCYTESUR TRACE (A) 03/31/2021 0209    Pertinent Imaging: Urine reviewed.  Urine sent for culture.  Chart reviewed  Assessment & Plan: Patient has mild mixed incontinence.  She has leakage without awareness.  She has a lot of dampness.  She may have them as well as the sleeping.  Role of urodynamics and cystoscopy discussed.  Role of physical therapy discussed.  Based on body habitus a bulking agent would be best done in the operating room.  Having said that perhaps a wider speculum and better positioning of legs would be helpful if done in the office.  Patient like to go ahead with testing as she  thinks she has had a lifelong problem with voiding dysfunction that is worsening and does not believe that physical therapy will help her enough.  We will proceed accordingly  1. Stress incontinence, female (Primary)  - Urinalysis, Complete   No follow-ups on file.  Martina Sinner, MD  [facility] [address] [place], [place] [address] [phone]

## 2024-01-17 LAB — CULTURE, URINE COMPREHENSIVE

## 2024-02-25 ENCOUNTER — Other Ambulatory Visit: Payer: BC Managed Care – PPO | Admitting: Urology

## 2024-05-05 ENCOUNTER — Ambulatory Visit: Admitting: Urology

## 2024-05-05 VITALS — BP 122/85 | HR 64 | Ht 63.0 in | Wt 228.0 lb

## 2024-05-05 DIAGNOSIS — N3281 Overactive bladder: Secondary | ICD-10-CM

## 2024-05-05 DIAGNOSIS — N3946 Mixed incontinence: Secondary | ICD-10-CM

## 2024-05-05 DIAGNOSIS — N393 Stress incontinence (female) (male): Secondary | ICD-10-CM | POA: Diagnosis not present

## 2024-05-05 LAB — MICROSCOPIC EXAMINATION: Epithelial Cells (non renal): >10 /HPF — AB (ref 0–10)

## 2024-05-05 LAB — URINALYSIS, COMPLETE
Bilirubin, UA: NEGATIVE
Glucose, UA: NEGATIVE
Ketones, UA: NEGATIVE
Leukocytes,UA: NEGATIVE
Nitrite, UA: NEGATIVE
Specific Gravity, UA: 1.025 (ref 1.005–1.030)
Urobilinogen, Ur: 0.2 mg/dL (ref 0.2–1.0)
pH, UA: 6 (ref 5.0–7.5)

## 2024-05-05 MED ORDER — GEMTESA 75 MG PO TABS: 75.0000 mg | ORAL_TABLET | Freq: Every day | ORAL | 11 refills | Status: DC

## 2024-05-05 MED ORDER — GEMTESA 75 MG PO TABS: 75.0000 mg | ORAL_TABLET | Freq: Every day | ORAL | 11 refills | Status: AC

## 2024-05-05 MED ORDER — GEMTESA 75 MG PO TABS: 75.0000 mg | ORAL_TABLET | Freq: Every day | ORAL | Status: AC

## 2024-05-05 NOTE — Progress Notes (Signed)
 05/05/2024 10:28 AM   Caroline Sutton 08-04-1981 [id number]  Referring provider: No referring provider defined for this encounter.  Chief Complaint  Patient presents with   Cysto    HPI: I was consulted to assess the patient's urinary incontinence.  She sometimes leaks with coughing sneezing and bending and lifting.  If she stretches a certain way she will leak.  Sometimes she has small-volume urge incontinence.  She says she feels dampness a lot of the time and possibly even at night with leakage without awareness.  She does not think it is discharge.  She wears 1-2 pads a day that are damp   She voids every 2 hours and does not have nocturia.  Her flow was weak and she sometimes does not feel empty   Patient says she had an urgent frequent bladder even as a child   No hysterectomy    On pelvic examination patient had grade 1 hypermobility of the bladder neck and no stress incontinence with a moderate cough. No prolapse   Patient has mild mixed incontinence.  She has leakage without awareness.  She has a lot of dampness.  She may have them as well as the sleeping.  Role of urodynamics and cystoscopy discussed.  Role of physical therapy discussed.  Based on body habitus a bulking agent would be best done in the operating room.  Having said that perhaps a wider speculum and better positioning of legs would be helpful if done in the office.  Patient like to go ahead with testing as she thinks she has had a lifelong problem with voiding dysfunction that is worsening and does not believe that physical therapy will help her enough.  We will proceed accordingly   Today Frequency stable.  Incontinence stable.  Last urine culture negative When I asked the patient what the most bothersome symptom was it was actually the urge incontinence. She wants to know why she always feels urgency and frequency as though she has an infection but her urine is negative when tested.  She does not get pelvic  pain or pressure.  During urodynamics the patient voided 57 mL.  Maximum flow was 15 mL/s.  Residual was a few milliliters.  Maximum bladder capacity was 300 mL.  She had increased bladder sensation.  It was felt that her bladder was stable.  She had no stress incontinence generating a Valsalva pressure of 114 cm of water.  During voiding she voided 257 mL.  Maximum flow was 11 mL/s.  Maximum voiding pressure was approximately 57 cm water with a peak as high as 95 cm water.  EMG activity increased during the voiding phase.  Residual was 43 mL.  Bladder neck distended less than a centimeter.  The voluntary void was prolonged.  The details of the urodynamics are signed dictated  On repeat pelvic examination based on body habitus she had minimal hypermobility of the bladder neck and negative cough test.  She had little bit of blood at the tip of the speculum that was very mild and she is just finishing her cycle.  It would be appropriate to bulking agent in the office  Cystoscopy: Patient underwent flexible cystoscopy.  Urine was cloudy with some few white flecks.  Urine sent for culture.  Bladder mucosa and trigone were normal.       PMH: No past medical history on file.  Surgical History:   Home Medications:  Allergies as of 05/05/2024       Reactions  Morphine And Codeine Rash        Medication List        Accurate as of May 05, 2024 10:28 AM. If you have any questions, ask your nurse or doctor.          atenolol 25 MG tablet Commonly known as: TENORMIN   tirzepatide 2.5 MG/0.5ML Pen Commonly known as: MOUNJARO        Allergies:  Allergies  Allergen Reactions   Morphine And Codeine Rash    Family History: No family history on file.  Social History:  has no history on file for tobacco use, alcohol use, and drug use.  ROS:                                        Physical Exam: BP 122/85   Pulse 64   Ht 5' 3 (1.6 m)   Wt 103.4 kg    BMI 40.39 kg/m   Constitutional:  Alert and oriented, No acute distress.  Laboratory Data: Lab Results  Component Value Date   WBC 12.8 (H) 03/31/2021   HGB 10.2 (L) 03/31/2021   HCT 33.3 (L) 03/31/2021   MCV 76.2 (L) 03/31/2021   PLT 376 03/31/2021    Lab Results  Component Value Date   CREATININE 1.02 (H) 03/31/2021    No results found for: PSA  No results found for: TESTOSTERONE  No results found for: HGBA1C  Urinalysis    Component Value Date/Time   COLORURINE YELLOW (A) 03/31/2021 0209   APPEARANCEUR Hazy (A) 01/14/2024 0902   LABSPEC 1.026 03/31/2021 0209   PHURINE 5.0 03/31/2021 0209   GLUCOSEU Negative 01/14/2024 0902   HGBUR MODERATE (A) 03/31/2021 0209   BILIRUBINUR Negative 01/14/2024 0902   KETONESUR NEGATIVE 03/31/2021 0209   PROTEINUR 1+ (A) 01/14/2024 0902   PROTEINUR 100 (A) 03/31/2021 0209   NITRITE Negative 01/14/2024 0902   NITRITE NEGATIVE 03/31/2021 0209   LEUKOCYTESUR Negative 01/14/2024 0902   LEUKOCYTESUR TRACE (A) 03/31/2021 0209    Pertinent Imaging: Urine positive and sent for culture.  Assessment & Plan: Patient has an overactive bladder and if she does have stress incontinence is very mild.  She understands that her dampness or leakage without awareness likely is more from bladder overactivity but also there can be some vaginal moisture or vaginal voiding based on body habitus.  She also has a lot of urgency and frequency and I do not believe she has interstitial cystitis I be concerned about offering her a sling based upon age and mild findings.  A bulking agent and anesthesia would be a distant option.  I have recommended physical therapy with medication and proceed accordingly.  Call if culture positive.  Physical therapy consultation sent and reassess on Gemtesa samples and prescription.  The more I speak with her it appears she primarily has the overactive bladder and even a bulking agent may not reach her treatment goal  1.  Stress incontinence, female (Primary)  - Urinalysis, Complete   No follow-ups on file.  Devorah Fonder, MD  [facility] [address] [place], [place] [address] [phone]

## 2024-05-08 LAB — CULTURE, URINE COMPREHENSIVE

## 2024-07-14 ENCOUNTER — Ambulatory Visit: Admitting: Urology

## 2024-09-01 ENCOUNTER — Other Ambulatory Visit: Payer: Self-pay

## 2024-09-01 ENCOUNTER — Emergency Department
Admission: EM | Admit: 2024-09-01 | Discharge: 2024-09-02 | Disposition: A | Discharge status: Home / Self Care | Attending: Emergency Medicine | Admitting: Emergency Medicine

## 2024-09-01 DIAGNOSIS — M26622 Arthralgia of left temporomandibular joint: Secondary | ICD-10-CM | POA: Diagnosis not present

## 2024-09-01 DIAGNOSIS — R519 Headache, unspecified: Secondary | ICD-10-CM | POA: Diagnosis present

## 2024-09-01 MED ORDER — OXYCODONE-ACETAMINOPHEN 5-325 MG PO TABS
1.0000 | ORAL_TABLET | Freq: Once | ORAL | Status: AC
  Administered 2024-09-01: 1 via ORAL
  Filled 2024-09-01: qty 1

## 2024-09-01 NOTE — ED Triage Notes (Signed)
 Pt reports she has had dental pain on left side with headache and has not taken her BP meds in 2 days and noted her BP to be high tonight.

## 2024-09-02 ENCOUNTER — Emergency Department

## 2024-09-02 LAB — BASIC METABOLIC PANEL WITH GFR
Anion gap: 10 (ref 5–15)
BUN: 13 mg/dL (ref 6–20)
CO2: 26 mmol/L (ref 22–32)
Calcium: 8.7 mg/dL — ABNORMAL LOW (ref 8.9–10.3)
Chloride: 103 mmol/L (ref 98–111)
Creatinine, Ser: 0.83 mg/dL (ref 0.44–1.00)
GFR, Estimated: >60 mL/min (ref >60)
Glucose, Bld: 105 mg/dL — ABNORMAL HIGH (ref 70–99)
Potassium: 3.7 mmol/L (ref 3.5–5.1)
Sodium: 139 mmol/L (ref 135–145)

## 2024-09-02 LAB — CBC WITH DIFFERENTIAL/PLATELET
Abs Immature Granulocytes: 0.05 K/uL (ref 0.00–0.07)
Basophils Absolute: 0.1 K/uL (ref 0.0–0.1)
Basophils Relative: 1 %
Eosinophils Absolute: 0.4 K/uL (ref 0.0–0.5)
Eosinophils Relative: 4 %
HCT: 37.8 % (ref 36.0–46.0)
Hemoglobin: 12.4 g/dL (ref 12.0–15.0)
Immature Granulocytes: 1 %
Lymphocytes Relative: 35 %
Lymphs Abs: 3.5 K/uL (ref 0.7–4.0)
MCH: 28.4 pg (ref 26.0–34.0)
MCHC: 32.8 g/dL (ref 30.0–36.0)
MCV: 86.5 fL (ref 80.0–100.0)
Monocytes Absolute: 0.8 K/uL (ref 0.1–1.0)
Monocytes Relative: 8 %
Neutro Abs: 5.3 K/uL (ref 1.7–7.7)
Neutrophils Relative %: 51 %
Platelets: 298 K/uL (ref 150–400)
RBC: 4.37 MIL/uL (ref 3.87–5.11)
RDW: 12.8 % (ref 11.5–15.5)
Smear Review: NORMAL
WBC: 10 K/uL (ref 4.0–10.5)
nRBC: 0 % (ref 0.0–0.2)

## 2024-09-02 LAB — HCG, QUANTITATIVE, PREGNANCY: hCG, Beta Chain, Quant, S: 1 m[IU]/mL (ref <5)

## 2024-09-02 MED ORDER — DEXAMETHASONE SOD PHOSPHATE PF 10 MG/ML IJ SOLN
10.0000 mg | Freq: Once | INTRAMUSCULAR | Status: AC
Start: 2024-09-02 — End: 2024-09-02
  Administered 2024-09-02: 10 mg via INTRAVENOUS

## 2024-09-02 MED ORDER — SODIUM CHLORIDE 0.9 % IV BOLUS
500.0000 mL | Freq: Once | INTRAVENOUS | Status: AC
  Administered 2024-09-02: 500 mL via INTRAVENOUS

## 2024-09-02 MED ORDER — KETOROLAC TROMETHAMINE 15 MG/ML IJ SOLN
15.0000 mg | Freq: Once | INTRAMUSCULAR | Status: AC
  Administered 2024-09-02: 15 mg via INTRAVENOUS
  Filled 2024-09-02: qty 1

## 2024-09-02 MED ORDER — PROCHLORPERAZINE EDISYLATE 10 MG/2ML IJ SOLN
10.0000 mg | Freq: Once | INTRAMUSCULAR | Status: AC
  Administered 2024-09-02: 10 mg via INTRAVENOUS
  Filled 2024-09-02: qty 2

## 2024-09-02 MED ORDER — DIPHENHYDRAMINE HCL 50 MG/ML IJ SOLN
25.0000 mg | Freq: Once | INTRAMUSCULAR | Status: AC
  Administered 2024-09-02: 25 mg via INTRAVENOUS
  Filled 2024-09-02: qty 1

## 2024-09-02 MED ORDER — ATENOLOL 25 MG PO TABS
25.0000 mg | ORAL_TABLET | Freq: Once | ORAL | Status: AC
  Administered 2024-09-02: 25 mg via ORAL
  Filled 2024-09-02: qty 1

## 2024-09-02 MED ORDER — IOHEXOL 350 MG/ML SOLN
75.0000 mL | Freq: Once | INTRAVENOUS | Status: AC | PRN
  Administered 2024-09-02: 75 mL via INTRAVENOUS

## 2024-09-02 NOTE — Discharge Instructions (Signed)
 Take acetaminophen 650 mg and ibuprofen 400 mg every 6 hours for pain.  Take with food.  Thank you for choosing [place] for your health care today!  Please see your primary doctor this week for a follow up appointment.   If you have any new, worsening, or unexpected symptoms call your doctor right away or come back to the emergency department for reevaluation.  It was my pleasure to care for you today.   Daneil Dan Modesto Charon, MD

## 2024-09-02 NOTE — ED Notes (Signed)
Patient given discharge instructions including importance of follow up appt as needed with stated understanding. INT removed, cannula intact, pressure dressing applied. Patient stable and ambulatory with steady even gait on dispo.

## 2024-09-02 NOTE — ED Provider Notes (Signed)
 [facility] Provider Note    Event Date/Time   First MD Initiated Contact with Patient 09/01/24 2352     (approximate)   History   Dental Pain and Headache   HPI  Caroline Sutton is a 43 y.o. female   Past medical history of no significant past medical history presents to the Emergency Department with headache.  There is a pain in front of her ear around her TMJ on the left side and radiates both up and down across her face.  There is some pain radiating down to the neck as well.  She has had no traumatic injury.  She has had no dental pain or loose teeth or teeth decay.  No problems with breathing or swallowing.  No rashes or vision changes.  Independent Historian contributed to assessment above: Husband at bedside corroborates information past medical history as above  External Medical Documents Reviewed: Prior outpatient notes      Physical Exam   Triage Vital Signs: ED Triage Vitals  Encounter Vitals Group     BP 09/01/24 2310 (!) 215/109     Girls Systolic BP Percentile --      Girls Diastolic BP Percentile --      Boys Systolic BP Percentile --      Boys Diastolic BP Percentile --      Pulse Rate 09/01/24 2310 65     Resp 09/01/24 2310 18     Temp 09/01/24 2310 98.5 F (36.9 C)     Temp src --      SpO2 09/01/24 2310 100 %     Weight 09/01/24 2309 225 lb (102.1 kg)     Height 09/01/24 2309 5' 2 (1.575 m)     Head Circumference --      Peak Flow --      Pain Score 09/01/24 2309 10     Pain Loc --      Pain Education --      Exclude from Growth Chart --     Most recent vital signs: Vitals:   09/02/24 0300 09/02/24 0447  BP: 106/63 108/68  Pulse: (!) 54 (!) 51  Resp: 18 18  Temp:  98.4 F (36.9 C)  SpO2: 96% 94%    General: Awake, no distress.  CV:  Good peripheral perfusion.  Resp:  Normal effort.  Abd:  No distention.  Other:  Awake alert comfortable appearing in no acute distress nontoxic appearing, no meningismus,  no tenderness to palpation of the temporal area and no skin rashes noted to the face.  Extraocular movements intact no proptosis.  There is point tenderness to palpation of the TMJ on the left side.  There is no crepitus.  Airway is intact.  Teeth appear normal, no looseness no decay   ED Results / Procedures / Treatments   Labs (all labs ordered are listed, but only abnormal results are displayed) Labs Reviewed  BASIC METABOLIC PANEL WITH GFR - Abnormal; Notable for the following components:      Result Value   Glucose, Bld 105 (*)    Calcium 8.7 (*)    All other components within normal limits  CBC WITH DIFFERENTIAL/PLATELET  HCG, QUANTITATIVE, PREGNANCY  POC URINE PREG, ED     I ordered and reviewed the above labs they are notable for cell counts and electrolytes unremarkable   RADIOLOGY I independently reviewed and interpreted CT of the head and see no obvious bleeding or midline shift I also reviewed  radiologist's formal read.   PROCEDURES:  Critical Care performed: No  Procedures   MEDICATIONS ORDERED IN ED: Medications  oxyCODONE-acetaminophen (PERCOCET/ROXICET) 5-325 MG per tablet 1 tablet (1 tablet Oral Given 09/01/24 2311)  ketorolac (TORADOL) 15 MG/ML injection 15 mg (15 mg Intravenous Given 09/02/24 0224)  prochlorperazine (COMPAZINE) injection 10 mg (10 mg Intravenous Given 09/02/24 0224)  diphenhydrAMINE (BENADRYL) injection 25 mg (25 mg Intravenous Given 09/02/24 0224)  dexamethasone (DECADRON) injection 10 mg (10 mg Intravenous Given 09/02/24 0224)  sodium chloride 0.9 % bolus 500 mL (0 mLs Intravenous Stopped 09/02/24 0403)  atenolol (TENORMIN) tablet 25 mg (25 mg Oral Given 09/02/24 0220)  iohexol (OMNIPAQUE) 350 MG/ML injection 75 mL (75 mLs Intravenous Contrast Given 09/02/24 0327)     IMPRESSION / MDM / ASSESSMENT AND PLAN / ED COURSE  I reviewed the triage vital signs and the nursing notes.                                Patient's  presentation is most consistent with acute presentation with potential threat to life or bodily function.  Differential diagnosis includes, but is not limited to, migraine headache, TMJ dysfunction, temporal arteritis, trigeminal neuralgia, dissection, head bleed, dental infection, considered but less likely sepsis or meningitis   MDM:    I think most likely a migraine headache given unilateral headache, photophobia, or TMJ given the point tenderness in the TMJ area.  With radiation down to the neck, and quite severe pain as well as hypertension in the setting of not taking her antihypertensive medications in the last couple of days, I did consider dissection or head bleed and got a CT angiogram of the head which fortunately looks negative.  Given her home medications and migraine cocktail greatly reduced her blood pressure and nearly resolved her pain.  Given unremarkable workup as above, relatively benign exam as above, and resolution of symptoms, I think she can be discharged.       FINAL CLINICAL IMPRESSION(S) / ED DIAGNOSES   Final diagnoses:  Nonintractable headache, unspecified chronicity pattern, unspecified headache type  Arthralgia of left temporomandibular joint     Rx / DC Orders   ED Discharge Orders     None        Note:  This document was prepared using Dragon voice recognition software and may include unintentional dictation errors.    Cyrena Mylar, MD 09/02/24 0500
